# Patient Record
Sex: Female | Born: 1960 | Race: White | Hispanic: No | Marital: Married | Smoking: Never smoker
Health system: Southern US, Community
[De-identification: ages and names within clinical notes are randomized; demographics above are authoritative.]

## PROBLEM LIST (undated history)

## (undated) DIAGNOSIS — E079 Disorder of thyroid, unspecified: Secondary | ICD-10-CM

## (undated) DIAGNOSIS — R002 Palpitations: Secondary | ICD-10-CM

## (undated) DIAGNOSIS — Z8659 Personal history of other mental and behavioral disorders: Secondary | ICD-10-CM

## (undated) DIAGNOSIS — K635 Polyp of colon: Secondary | ICD-10-CM

## (undated) DIAGNOSIS — T7840XA Allergy, unspecified, initial encounter: Secondary | ICD-10-CM

## (undated) DIAGNOSIS — Z86018 Personal history of other benign neoplasm: Secondary | ICD-10-CM

## (undated) DIAGNOSIS — K824 Cholesterolosis of gallbladder: Secondary | ICD-10-CM

## (undated) DIAGNOSIS — D649 Anemia, unspecified: Secondary | ICD-10-CM

## (undated) HISTORY — DX: Anemia, unspecified: D64.9

## (undated) HISTORY — DX: Personal history of other mental and behavioral disorders: Z86.59

## (undated) HISTORY — DX: Allergy, unspecified, initial encounter: T78.40XA

## (undated) HISTORY — DX: Personal history of other benign neoplasm: Z86.018

## (undated) HISTORY — DX: Cholesterolosis of gallbladder: K82.4

## (undated) HISTORY — DX: Disorder of thyroid, unspecified: E07.9

## (undated) HISTORY — DX: Palpitations: R00.2

## (undated) HISTORY — DX: Polyp of colon: K63.5

---

## 2003-05-17 HISTORY — PX: ABDOMINAL HYSTERECTOMY: SHX81

## 2007-01-18 ENCOUNTER — Ambulatory Visit: Payer: Self-pay | Admitting: *Deleted

## 2008-05-16 HISTORY — PX: CHOLECYSTECTOMY: SHX55

## 2008-05-16 HISTORY — PX: TUMOR REMOVAL: SHX12

## 2008-12-25 ENCOUNTER — Encounter: Admission: RE | Admit: 2008-12-25 | Discharge: 2008-12-25 | Payer: Self-pay | Admitting: Family Medicine

## 2009-04-02 ENCOUNTER — Encounter: Admission: RE | Admit: 2009-04-02 | Discharge: 2009-04-02 | Payer: Self-pay | Admitting: Family Medicine

## 2010-06-06 ENCOUNTER — Encounter: Payer: Self-pay | Admitting: Family Medicine

## 2010-06-07 ENCOUNTER — Encounter: Payer: Self-pay | Admitting: Family Medicine

## 2010-07-24 IMAGING — MG MM SCREEN MAMMOGRAM BILATERAL
4 series · 4 of 4 positions shown · non-contrast
Comparison: none

DG SCREEN MAMMOGRAM BILATERAL
Bilateral CC and MLO view(s) were taken.

DIGITAL SCREENING MAMMOGRAM WITH CAD:
The breast tissue is heterogeneously dense.  No masses or malignant type calcifications are 
identified.
Images were processed with CAD.

[R CC]
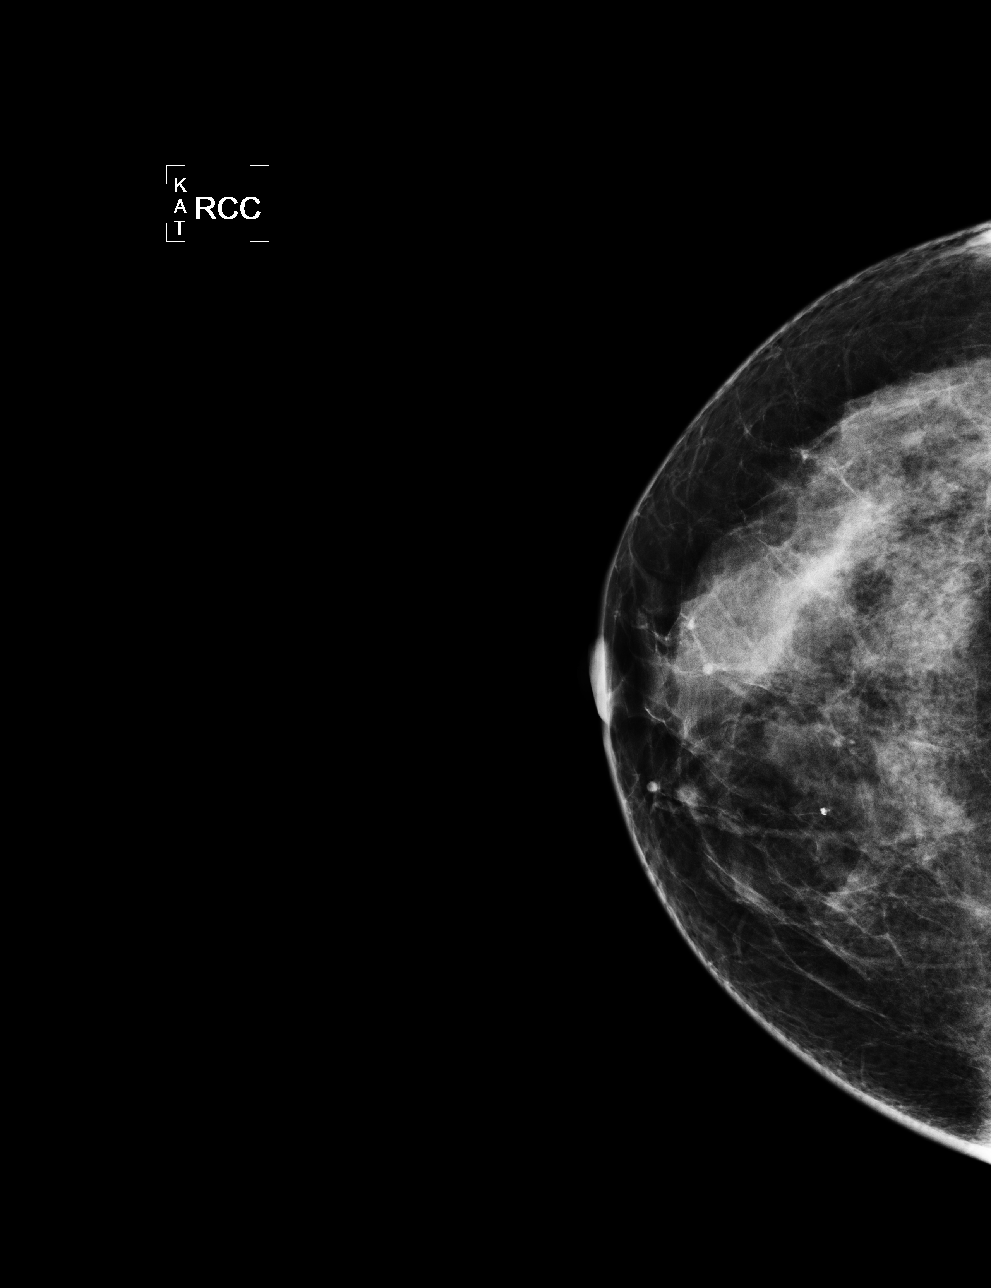

[L CC]
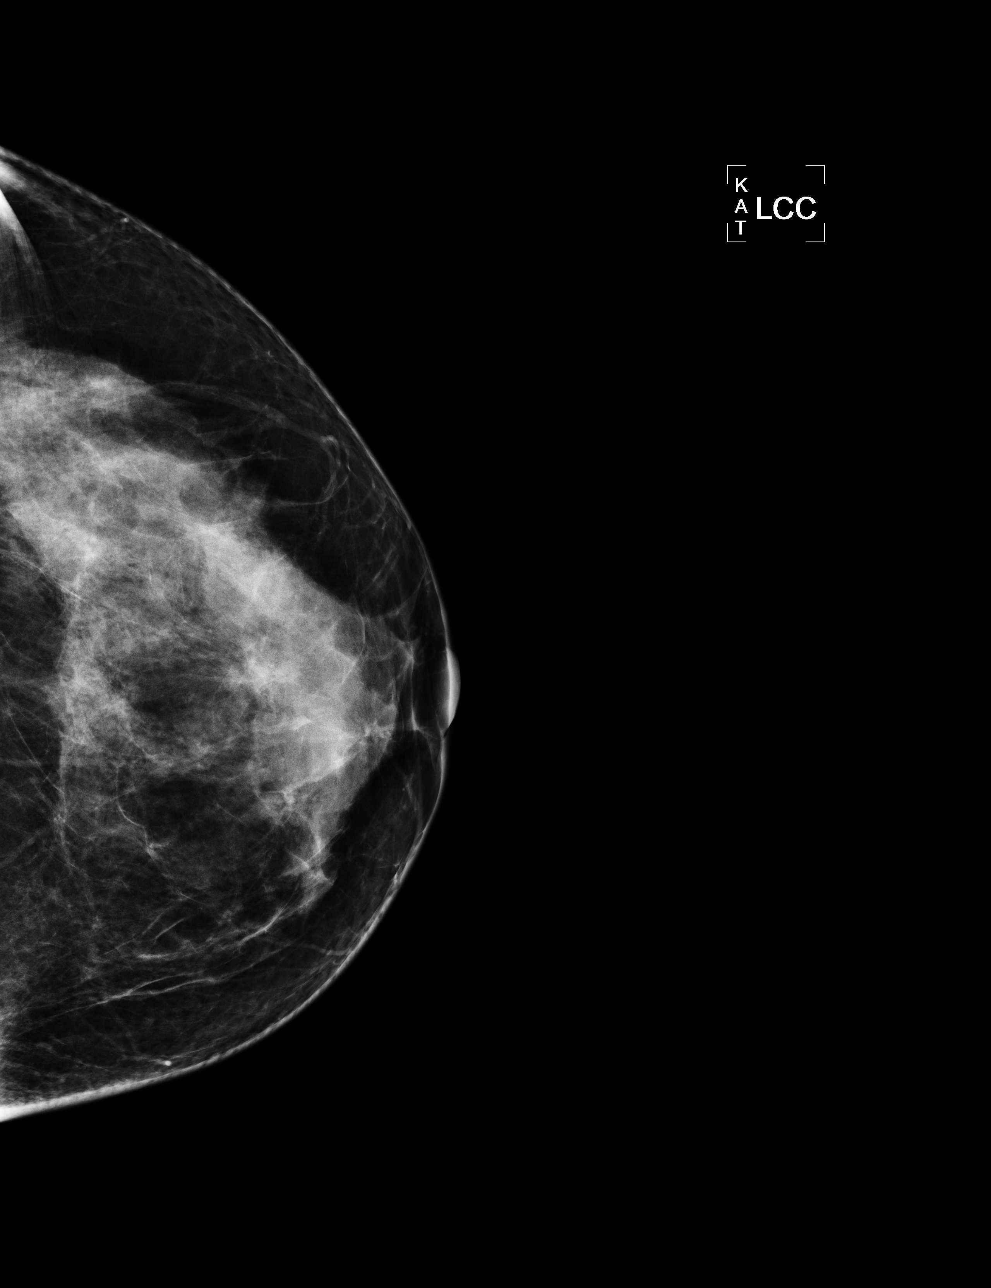

[L MLO]
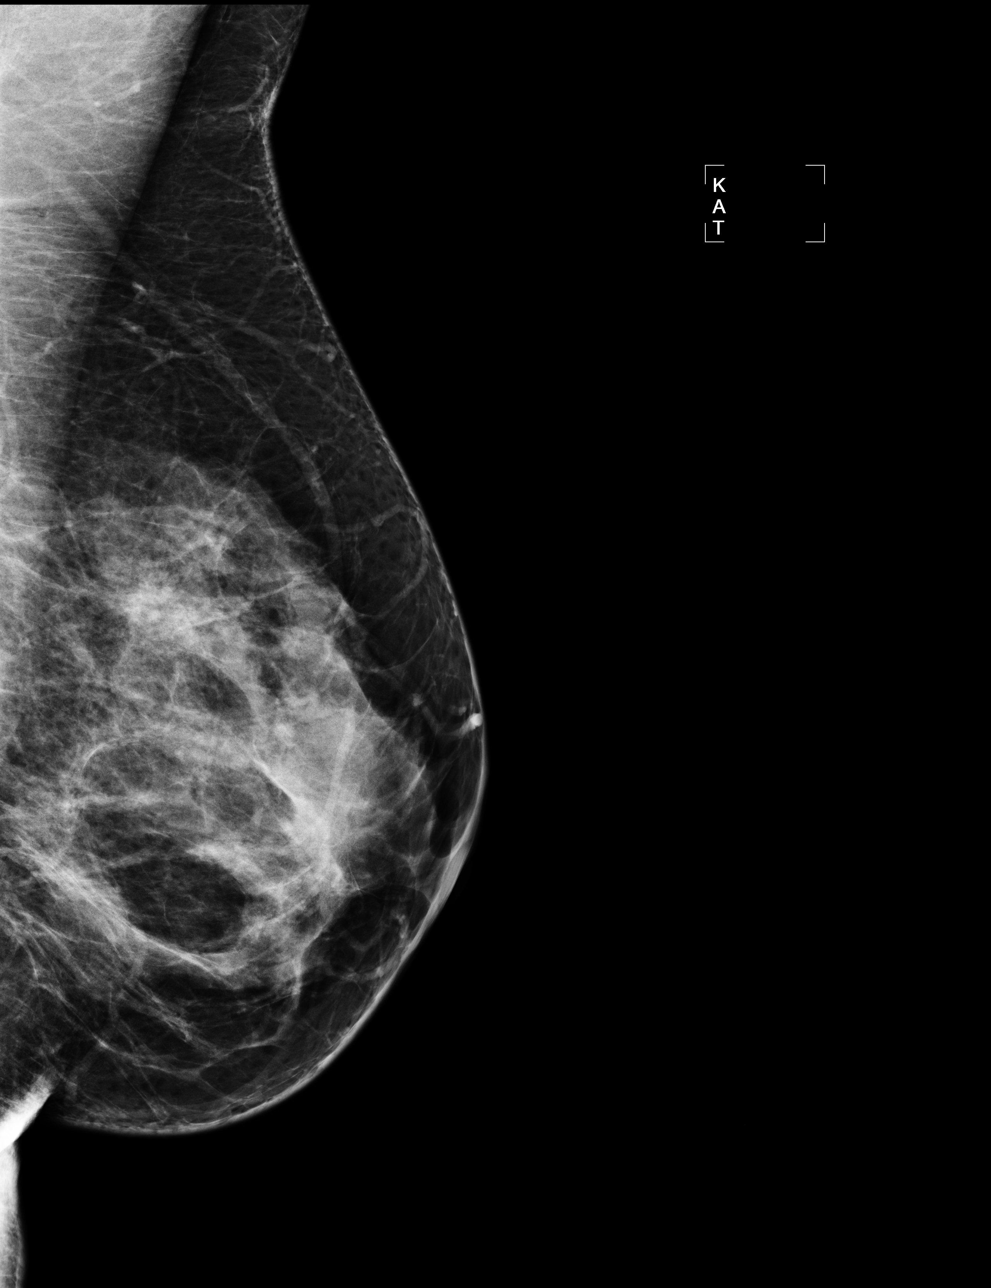

[R MLO]
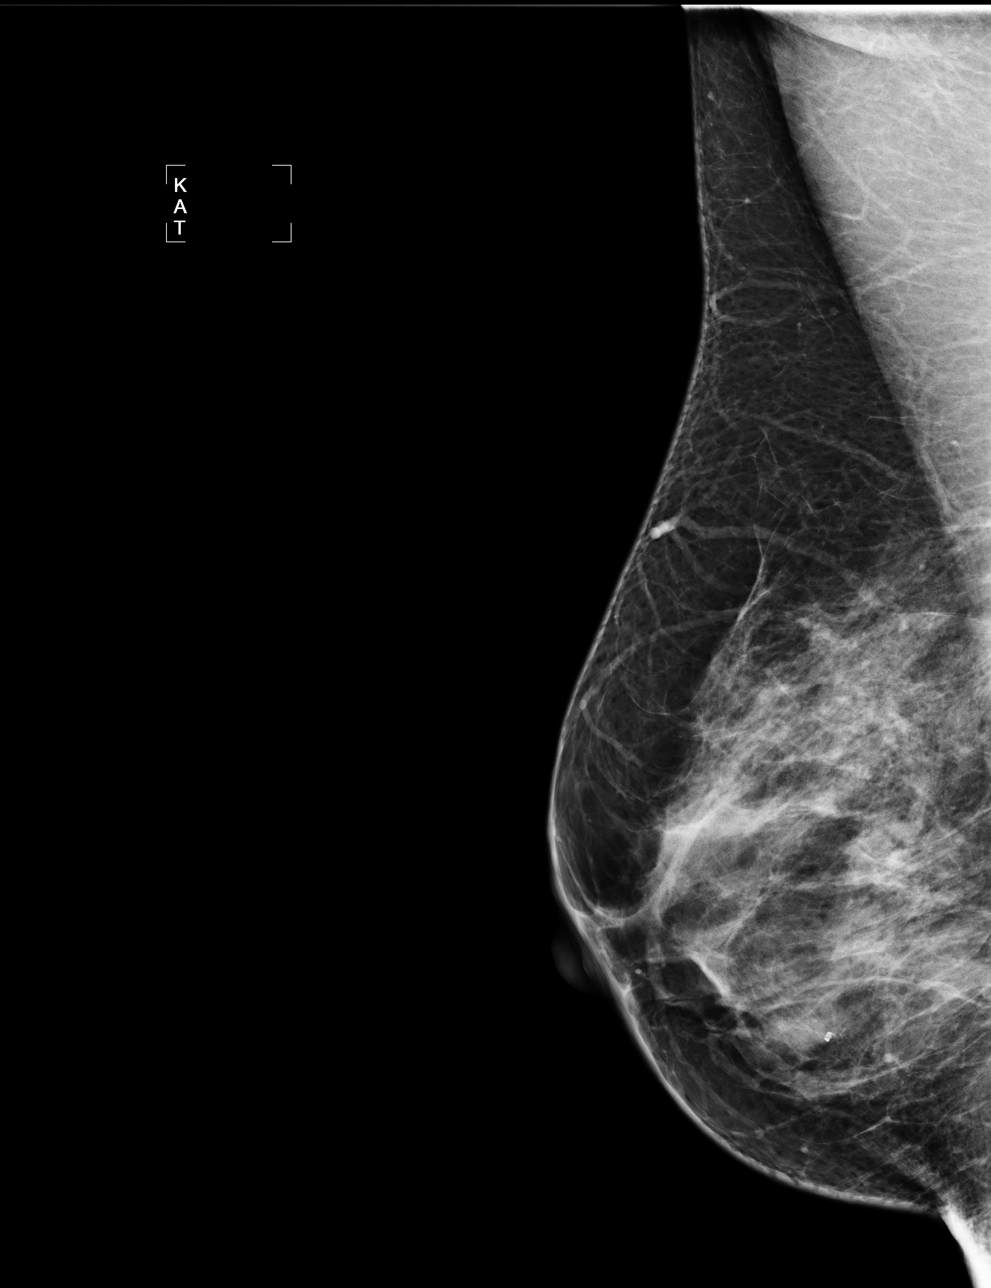

[4 of 4 positions shown; findings below may reference images not displayed]

IMPRESSION: No specific mammographic evidence of malignancy.  Next screening mammogram is recommended in one 
year.

A result letter of this screening mammogram will be mailed directly to the patient.

ASSESSMENT: Negative - BI-RADS 1

Screening mammogram in 1 year.
,

## 2010-09-28 NOTE — Procedures (Signed)
DUPLEX DEEP VENOUS EXAM - LOWER EXTREMITY   INDICATION:  Right knee pain.   HISTORY:  Edema:  No.  Trauma/Surgery:  No.  Pain:  Patient complains of right knee pain for approximately one week.  PE:  No.  Previous DVT:  No.  Anticoagulants:  No.  Other:  No.    DUPLEX EXAM:                CFV    SFV    PopV  PTV    GSV                R   L  R   L  R  L  R   L  R  L  Thrombosis    O.  O. O.     O.    O.     O.  Spontaneous   +   +  +      +     +      +  Phasic        +   +  +      +     +      +  Augmentation  +   +  +      +     +      +  Compressible  +   +  +      +     +      +  Competent     +   +  +      +     +      +   Legend:  + - yes  o - no  p - partial  D - decreased   IMPRESSION:  No evidence of right leg deep venous thrombosis,  superficial venous thrombosis or Baker cyst.    _____________________________  Quita Skye. Hart Rochester, M.D.   MC/MEDQ  D:  01/18/2007  T:  01/18/2007  Job:  [id number]

## 2015-05-17 HISTORY — PX: COLONOSCOPY W/ POLYPECTOMY: SHX1380

## 2018-03-07 DIAGNOSIS — Z1231 Encounter for screening mammogram for malignant neoplasm of breast: Secondary | ICD-10-CM | POA: Diagnosis not present

## 2018-03-08 LAB — HM MAMMOGRAPHY

## 2018-06-08 ENCOUNTER — Encounter: Payer: Self-pay | Admitting: Family Medicine

## 2018-06-08 ENCOUNTER — Ambulatory Visit (INDEPENDENT_AMBULATORY_CARE_PROVIDER_SITE_OTHER): Payer: 59 | Admitting: Family Medicine

## 2018-06-08 VITALS — BP 118/66 | HR 57 | Temp 98.8°F | Resp 16 | Ht 66.0 in | Wt 144.1 lb

## 2018-06-08 DIAGNOSIS — L659 Nonscarring hair loss, unspecified: Secondary | ICD-10-CM | POA: Insufficient documentation

## 2018-06-08 DIAGNOSIS — Z7689 Persons encountering health services in other specified circumstances: Secondary | ICD-10-CM | POA: Diagnosis not present

## 2018-06-08 DIAGNOSIS — E079 Disorder of thyroid, unspecified: Secondary | ICD-10-CM

## 2018-06-08 DIAGNOSIS — R5383 Other fatigue: Secondary | ICD-10-CM | POA: Diagnosis not present

## 2018-06-08 DIAGNOSIS — R002 Palpitations: Secondary | ICD-10-CM | POA: Insufficient documentation

## 2018-06-08 NOTE — Patient Instructions (Addendum)
Palpitations Palpitations are feelings that your heartbeat is not normal. Your heartbeat may feel like it is:  Uneven.  Faster than normal.  Fluttering.  Skipping a beat. This is usually not a serious problem. In some cases, you may need tests to rule out any serious problems. Follow these instructions at home: Pay attention to any changes in your condition. Take these actions to help manage your symptoms: Eating and drinking  Avoid: ? Coffee, tea, soft drinks, and energy drinks. ? Chocolate. ? Alcohol. ? Diet pills. Lifestyle   Try to lower your stress. These things can help you relax: ? Yoga. ? Deep breathing and meditation. ? Exercise. ? Using words and images to create positive thoughts (guided imagery). ? Using your mind to control things in your body (biofeedback).  Do not use drugs.  Get plenty of rest and sleep. Keep a regular bed time. General instructions   Take over-the-counter and prescription medicines only as told by your doctor.  Do not use any products that contain nicotine or tobacco, such as cigarettes and e-cigarettes. If you need help quitting, ask your doctor.  Keep all follow-up visits as told by your doctor. This is important. You may need more tests if palpitations do not go away or get worse. Contact a doctor if:  Your symptoms last more than 24 hours.  Your symptoms occur more often. Get help right away if you:  Have chest pain.  Feel short of breath.  Have a very bad headache.  Feel dizzy.  Pass out (faint). Summary  Palpitations are feelings that your heartbeat is uneven or faster than normal. It may feel like your heart is fluttering or skipping a beat.  Avoid food and drinks that may cause palpitations. These include caffeine, chocolate, and alcohol.  Try to lower your stress. Do not smoke or use drugs.  Get help right away if you faint or have chest pain, shortness of breath, a severe headache, or dizziness. This  information is not intended to replace advice given to you by your health care provider. Make sure you discuss any questions you have with your health care provider. Document Released: 02/09/2008 Document Revised: 06/14/2017 Document Reviewed: 06/14/2017 Elsevier Interactive Patient Education  2019 [employer]    Please help us help you:  We are [facility]. Below you will find basic instructions that you may need to access in the future. Please help us help you by reading the instructions, which cover many of the frequent questions we experience.   Prescription refills and request:  -In order to allow more efficient response time, please call your pharmacy for all refills. They will forward the request electronically to us. This allows for the quickest possible response. Request left on a nurse line can take longer to refill, since these are checked as time allows between office patients and other phone calls.  - refill request can take up to 3-5 working days to complete.  - If request is sent electronically and request is appropiate, it is usually completed in 1-2 business days.  - all patients will need to be seen routinely for all chronic medical conditions requiring prescription medications (see follow-up below). If you are overdue for follow up on your condition, you will be asked to make an appointment and we will call in enough medication to cover you until your appointment (up to 30 days).  - all controlled substances will require a face to  face visit to request/refill.  - if you desire your prescriptions to go through a new pharmacy, and have an active script at original pharmacy, you will need to call your pharmacy and have scripts transferred to new pharmacy. This is completed between the pharmacy locations and not by your provider.    Results: If any images or labs were ordered, it can take up to 1 week to get results  depending on the test ordered and the lab/facility running and resulting the test. - Normal or stable results, which do not need further discussion, may be released to your mychart immediately with attached note to you. A call may not be generated for normal results. Please make certain to sign up for mychart. If you have questions on how to activate your mychart you can call the front office.  - If your results need further discussion, our office will attempt to contact you via phone, and if unable to reach you after 2 attempts, we will release your abnormal result to your mychart with instructions.  - All results will be automatically released in mychart after 1 week.  - Your provider will provide you with explanation and instruction on all relevant material in your results. Please keep in mind, results and labs may appear confusing or abnormal to the untrained eye, but it does not mean they are actually abnormal for you personally. If you have any questions about your results that are not covered, or you desire more detailed explanation than what was provided, you should make an appointment with your provider to do so.   Our office handles many outgoing and incoming calls daily. If we have not contacted you within 1 week about your results, please check your mychart to see if there is a message first and if not, then contact our office.  In helping with this matter, you help decrease call volume, and therefore allow [place] to be able to respond to patients needs more efficiently.   Acute office visits (sick visit):  An acute visit is intended for a new problem and are scheduled in shorter time slots to allow schedule openings for patients with new problems. This is the appropriate visit to discuss a new problem. Problems will not be addressed by phone call or Echart message. Appointment is needed if requesting treatment. In order to provide you with excellent quality medical care with proper time for you to  explain your problem, have an exam and receive treatment with instructions, these appointments should be limited to one new problem per visit. If you experience a new problem, in which you desire to be addressed, please make an acute office visit, we save openings on the schedule to accommodate you. Please do not save your new problem for any other type of visit, let us take care of it properly and quickly for you.   Follow up visits:  Depending on your condition(s) your provider will need to see you routinely in order to provide you with quality care and prescribe medication(s). Most chronic conditions (Example: hypertension, Diabetes, depression/anxiety... etc), require visits a couple times a year. Your provider will instruct you on proper follow up for your personal medical conditions and history. Please make certain to make follow up appointments for your condition as instructed. Failing to do so could result in lapse in your medication treatment/refills. If you request a refill, and are overdue to be seen on a condition, we will always provide you with a 30 day script (once) to  allow you time to schedule.    Medicare wellness (well visit): - we have a wonderful Nurse Selena Batten), that will meet with you and provide you will yearly medicare wellness visits. These visits should occur yearly (can not be scheduled less than 1 calendar year apart) and cover preventive health, immunizations, advance directives and screenings you are entitled to yearly through your medicare benefits. Do not miss out on your entitled benefits, this is when medicare will pay for these benefits to be ordered for you.  These are strongly encouraged by your provider and is the appropriate type of visit to make certain you are up to date with all preventive health benefits. If you have not had your medicare wellness exam in the last 12 months, please make certain to schedule one by calling the office and schedule your medicare wellness  with Selena Batten as soon as possible.   Yearly physical (well visit):  - Adults are recommended to be seen yearly for physicals. Check with your insurance and date of your last physical, most insurances require one calendar year between physicals. Physicals include all preventive health topics, screenings, medical exam and labs that are appropriate for gender/age and history. You may have fasting labs needed at this visit. This is a well visit (not a sick visit), new problems should not be covered during this visit (see acute visit).  - Pediatric patients are seen more frequently when they are younger. Your provider will advise you on well child visit timing that is appropriate for your their age. - This is not a medicare wellness visit. Medicare wellness exams do not have an exam portion to the visit. Some medicare companies allow for a physical, some do not allow a yearly physical. If your medicare allows a yearly physical you can schedule the medicare wellness with our nurse Selena Batten and have your physical with your provider after, on the same day. Please check with insurance for your full benefits.   Late Policy/No Shows:  - all new patients should arrive 15-30 minutes earlier than appointment to allow [place] time  to  obtain all personal demographics,  insurance information and for you to complete office paperwork. - All established patients should arrive 10-15 minutes earlier than appointment time to update all information and be checked in .  - In our best efforts to run on time, if you are late for your appointment you will be asked to either reschedule or if able, we will work you back into the schedule. There will be a wait time to work you back in the schedule,  depending on availability.  - If you are unable to make it to your appointment as scheduled, please call 24 hours ahead of time to allow [place] to fill the time slot with someone else who needs to be seen. If you do not cancel your appointment ahead of  time, you may be charged a no show fee.

## 2018-06-08 NOTE — Progress Notes (Signed)
Patient ID: Carrie Zamora, female  DOB: 1960/07/21, 58 y.o.   MRN: [id number] Patient Care Team    Relationship Specialty Notifications Start End  Natalia LeatherwoodKuneff, Renee A, DO PCP - General Family Medicine  06/08/18   Charna ElizabethMann, Jyothi, MD Consulting Physician Gastroenterology  06/11/18   [facility]    06/11/18     Chief Complaint  Patient presents with  . Establish Care    Pt complaints of having on and off again heart palpatations. Pt has concerns with her thyroid level as she has taken meds in the past for this.     Subjective:  Carrie Zamora is a 58 y.o.  female present for new patient establishment. All past medical history, surgical history, allergies, family history, immunizations, medications and social history were updated in the electronic medical record today. All recent labs, ED visits and hospitalizations within the last year were reviewed.  Palpitations:  Patient presents for new patient establishment with complaints of worsening palpitations.  She states she has had a thyroid disorder in the past and had needed medications.  However once medication was started at low dose she became over replaced.  She states when her thyroid was off she was feeling like she is today.  She reports she has had palpitations for about 10 years, but she has noticed them become worse over the last few weeks.  She feels the palpitations will be sustained for at least a minute.  Is any bowel changes.  She reports she is always fatigued, uncertain if that is worsened.  She does endorse a great deal of thinning hair.  Depression screen PHQ 2/9 06/08/2018  Decreased Interest 0  Down, Depressed, Hopeless 0  PHQ - 2 Score 0   No exam data present  Past Medical History:  Diagnosis Date  . Allergy   . Anemia   . Colon polyp   . Gallbladder polyp    since had cholecystectomy.   . H/O benign gastric tumor   . H/O eating disorder    As a young adult  . Palpitations 2010  . Thyroid disease     Allergies  Allergen Reactions  . Tetanus Toxoids Swelling  . Soy Allergy Other (See Comments)    Nausea and cramping   . Corn-Containing Products Other (See Comments)    No reaction just was told by allergist   . Hydrocodone Rash   Past Surgical History:  Procedure Laterality Date  . ABDOMINAL HYSTERECTOMY  2005  . CESAREAN SECTION  1984  . CHOLECYSTECTOMY  2010  . COLONOSCOPY W/ POLYPECTOMY  2017   Dr. Loreta AveMann  . TUMOR REMOVAL  2010   Tumors removed from stomach- Benign    Family History  Problem Relation Age of Onset  . Arthritis Mother   . Stroke Mother   . Arthritis/Rheumatoid Mother   . Diabetes Father   . Diabetes Sister   . Arthritis Maternal Grandmother   . Stroke Maternal Grandmother   . Stroke Maternal Grandfather   . Alcohol abuse Maternal Grandfather   . Alcohol abuse Paternal Grandmother   . Diabetes Paternal Grandfather   . Alcohol abuse Paternal Grandfather   . Asthma Sister    Social History   Socioeconomic History  . Marital status: Married    Spouse name: Not on file  . Number of children: 2  . Years of education: Not on file  . Highest education level: Not on file  Occupational History  . Not on file  Social Needs  . Financial resource strain: Not on file  . Food insecurity:    Worry: Not on file    Inability: Not on file  . Transportation needs:    Medical: Not on file    Non-medical: Not on file  Tobacco Use  . Smoking status: Never Smoker  . Smokeless tobacco: Never Used  Substance and Sexual Activity  . Alcohol use: Yes    Alcohol/week: 1.0 standard drinks    Types: 1 Glasses of wine per week    Comment: 1 glass of wine a month   . Drug use: Never  . Sexual activity: Yes    Partners: Male  Lifestyle  . Physical activity:    Days per week: Not on file    Minutes per session: Not on file  . Stress: Not on file  Relationships  . Social connections:    Talks on phone: Not on file    Gets together: Not on file    Attends  religious service: Not on file    Active member of club or organization: Not on file    Attends meetings of clubs or organizations: Not on file    Relationship status: Not on file  . Intimate partner violence:    Fear of current or ex partner: Not on file    Emotionally abused: Not on file    Physically abused: Not on file    Forced sexual activity: Not on file  Other Topics Concern  . Not on file  Social History Narrative   Marital status/children/pets: married   Education/employment: colege- Research officer, political party:      -smoke alarm in the home:Yes     - wears seatbelt: Yes     - Feels safe in their relationships: Yes   Allergies as of 06/08/2018      Reactions   Tetanus Toxoids Swelling   Soy Allergy Other (See Comments)   Nausea and cramping    Corn-containing Products Other (See Comments)   No reaction just was told by allergist    Hydrocodone Rash      Medication List    as of June 08, 2018 11:59 PM   You have not been prescribed any medications.     All past medical history, surgical history, allergies, family history, immunizations andmedications were updated in the EMR today and reviewed under the history and medication portions of their EMR.    Recent Results (from the past 2160 hour(s))  TSH     Status: None   Collection Time: 06/08/18  3:13 PM  Result Value Ref Range   TSH 1.31 0.40 - 4.50 mIU/L  T4, free     Status: None   Collection Time: 06/08/18  3:13 PM  Result Value Ref Range   Free T4 1.1 0.8 - 1.8 ng/dL  CBC w/Diff     Status: Abnormal   Collection Time: 06/08/18  3:13 PM  Result Value Ref Range   WBC 3.7 (L) 3.8 - 10.8 Thousand/uL   RBC 4.54 3.80 - 5.10 Million/uL   Hemoglobin 14.0 11.7 - 15.5 g/dL   HCT 16.1 09.6 - 04.5 %   MCV 89.0 80.0 - 100.0 fL   MCH 30.8 27.0 - 33.0 pg   MCHC 34.7 32.0 - 36.0 g/dL   RDW 40.9 81.1 - 91.4 %   Platelets 285 140 - 400 Thousand/uL   MPV 10.0 7.5 - 12.5 fL   Neutro Abs 1,628 1,500 - 7,800 cells/uL  Lymphs Abs 1,547 850 - 3,900 cells/uL   Absolute Monocytes 374 200 - 950 cells/uL   Eosinophils Absolute 111 15 - 500 cells/uL   Basophils Absolute 41 0 - 200 cells/uL   Neutrophils Relative % 44 %   Total Lymphocyte 41.8 %   Monocytes Relative 10.1 %   Eosinophils Relative 3.0 %   Basophils Relative 1.1 %  Iron, TIBC and Ferritin Panel     Status: None   Collection Time: 06/08/18  3:13 PM  Result Value Ref Range   Iron 110 45 - 160 mcg/dL   TIBC 119295 147250 - 829450 mcg/dL (calc)   %SAT 37 16 - 45 % (calc)   Ferritin 103 16 - 232 ng/mL  Vitamin D (25 hydroxy)     Status: Abnormal   Collection Time: 06/08/18  3:13 PM  Result Value Ref Range   Vit D, 25-Hydroxy 18 (L) 30 - 100 ng/mL    Comment: Vitamin D Status         25-OH Vitamin D: . Deficiency:                    <20 ng/mL Insufficiency:             20 - 29 ng/mL Optimal:                 > or = 30 ng/mL . For 25-OH Vitamin D testing on patients on  D2-supplementation and patients for whom quantitation  of D2 and D3 fractions is required, the QuestAssureD(TM) 25-OH VIT D, (D2,D3), LC/MS/MS is recommended: order  code [id number] (patients >2948yrs). . For more information on this test, go to: http://education.questdiagnostics.com/faq/FAQ163 (This link is being provided for  informational/educational purposes only.)       ROS: 14 pt review of systems performed and negative (unless mentioned in an HPI)  Objective: BP 118/66 (BP Location: Right Arm, Patient Position: Sitting, Cuff Size: Normal)   Pulse (!) 57   Temp 98.8 F (37.1 C) (Oral)   Resp 16   Ht 5\' 6"  (1.676 m)   Wt 144 lb 2 oz (65.4 kg)   SpO2 99%   BMI 23.26 kg/m  Gen: Afebrile. No acute distress. Nontoxic in appearance, well-developed, well-nourished,  Pleasant female.  HENT: AT. [place]. Bilateral TM visualized and normal in appearance, normal external auditory canal. MMM, no oral lesions, adequate dentition. Bilateral nares within normal limits. Throat without  erythema, ulcerations or exudates. Eyes:Pupils Equal Round Reactive to light, Extraocular movements intact,  Conjunctiva without redness, discharge or icterus. Neck/lymp/endocrine: Supple,no lymphadenopathy, no thyromegaly CV: RRR no murmur, no edema, +2/4 P posterior tibialis pulses. * Chest: CTAB, no wheeze, rhonchi or crackles.  Abd: Soft. NTND. BS present. no Masses palpated. No hepatosplenomegaly. No rebound tenderness or guarding. Skin: Warm and well-perfused. Skin intact. Neuro/Msk:  Normal gait. PERLA. EOMi. Alert. Oriented x3.   Psych: Normal affect, dress and demeanor. Normal speech. Normal thought content and judgment.   Assessment/plan: Carrie Zamora is a 58 y.o. female present for Est care.  Palpitations/Hair loss/Other fatigue/Thyroid disorder -Lab work today to rule out thyroid disorder, anemia, iron deficiency is causes palpitations.  Also collect vitamin D to rule out as potential cause of hair thinning. -Discussed need of cardiology referral if no causes found for worsening palpitations by labs.  Patient is currently asymptomatic with palpitations, but they are worsening.  - TSH - T4, free - CBC w/Diff - Iron, TIBC and Ferritin Panel - Vitamin D (25 hydroxy) -Follow-up  yearly for CPE when due.  Sooner if labs indicate need.    Return if symptoms worsen or fail to improve.  Greater than 30 minutes spent with patient, >50% of time spent face to face counseling   Note is dictated utilizing voice recognition software. Although note has been proof read prior to signing, occasional typographical errors still can be missed. If any questions arise, please do not hesitate to call for verification.  Electronically signed by: Felix Pacini, DO [facility] [place]

## 2018-06-09 LAB — CBC WITH DIFFERENTIAL/PLATELET
ABSOLUTE MONOCYTES: 374 {cells}/uL (ref 200–950)
Basophils Absolute: 41 cells/uL (ref 0–200)
Basophils Relative: 1.1 %
Eosinophils Absolute: 111 cells/uL (ref 15–500)
Eosinophils Relative: 3 %
HCT: 40.4 % (ref 35.0–45.0)
Hemoglobin: 14 g/dL (ref 11.7–15.5)
Lymphs Abs: 1547 cells/uL (ref 850–3900)
MCH: 30.8 pg (ref 27.0–33.0)
MCHC: 34.7 g/dL (ref 32.0–36.0)
MCV: 89 fL (ref 80.0–100.0)
MPV: 10 fL (ref 7.5–12.5)
Monocytes Relative: 10.1 %
NEUTROS PCT: 44 %
Neutro Abs: 1628 cells/uL (ref 1500–7800)
PLATELETS: 285 10*3/uL (ref 140–400)
RBC: 4.54 10*6/uL (ref 3.80–5.10)
RDW: 12.5 % (ref 11.0–15.0)
TOTAL LYMPHOCYTE: 41.8 %
WBC: 3.7 10*3/uL — AB (ref 3.8–10.8)

## 2018-06-09 LAB — IRON,TIBC AND FERRITIN PANEL
%SAT: 37 % (calc) (ref 16–45)
Ferritin: 103 ng/mL (ref 16–232)
Iron: 110 ug/dL (ref 45–160)
TIBC: 295 mcg/dL (calc) (ref 250–450)

## 2018-06-09 LAB — TSH: TSH: 1.31 m[IU]/L (ref 0.40–4.50)

## 2018-06-09 LAB — VITAMIN D 25 HYDROXY (VIT D DEFICIENCY, FRACTURES): Vit D, 25-Hydroxy: 18 ng/mL — ABNORMAL LOW (ref 30–100)

## 2018-06-09 LAB — T4, FREE: Free T4: 1.1 ng/dL (ref 0.8–1.8)

## 2018-06-11 ENCOUNTER — Other Ambulatory Visit: Payer: Self-pay | Admitting: Family Medicine

## 2018-06-11 ENCOUNTER — Encounter: Payer: Self-pay | Admitting: Family Medicine

## 2018-06-11 DIAGNOSIS — R002 Palpitations: Secondary | ICD-10-CM

## 2018-06-11 DIAGNOSIS — E559 Vitamin D deficiency, unspecified: Secondary | ICD-10-CM | POA: Insufficient documentation

## 2018-06-11 MED ORDER — VITAMIN D (ERGOCALCIFEROL) 1.25 MG (50000 UNIT) PO CAPS
50000.0000 [IU] | ORAL_CAPSULE | ORAL | 0 refills | Status: DC
Start: 2018-06-11 — End: 2018-06-12

## 2018-06-11 NOTE — Telephone Encounter (Addendum)
Please inform patient the following information: Her vit d is very low at 22. Level should be at least 30, and closer to 40 best. Low D can cause fatigue, aches and hair thinning.  I have called in a high dose once weekly vit d- take with food for 12 weeks then follow up. She should  also start OTC Vit D 1000u daily. Continue the OTC even after prescribed is completed.   Her other labs were normal and do not explain her palpitations. I have referred her to cardiology to further evaluate and consider monitor.   Please have ask her pharmacist about her reported soy allergy and possible effects of the Vitd. It may contain some soy- to what degree I am not sure. If it causes her nausea we will need to try a different format.   COuld not call in med- pended- no pharmacy listed

## 2018-06-11 NOTE — Telephone Encounter (Signed)
Pt notified of results and advised to please call our office back regarding the pharmacy that she wants medication sent into.

## 2018-06-11 NOTE — Telephone Encounter (Signed)
Copied from CRM [id number]. Topic: General - Call Back - No Documentation >> Jun 11, 2018 10:23 AM Jay Schlichter wrote: Reason for CRM: pt is calling back to let us know that her preferred pharmacy is [facility] in [place]

## 2018-06-12 ENCOUNTER — Telehealth: Payer: Self-pay | Admitting: *Deleted

## 2018-06-12 MED ORDER — ERGOCALCIFEROL 50 MCG (2000 UT) PO TABS: 2.0000 | ORAL_TABLET | Freq: Every day | ORAL | 2 refills | Status: AC

## 2018-06-12 NOTE — Telephone Encounter (Signed)
Called in the tabs- need to take 2 daily of this tab.  Since taking this format and daily- hold the OTC vit d we spoke of prior. This is a tab format- I do not see a SOY warning- like the capsule was. However, she should make certain with her pharmacist

## 2018-06-12 NOTE — Addendum Note (Signed)
Addended by: Felix Pacini A on: 06/12/2018 04:41 PM   Modules accepted: Orders

## 2018-06-12 NOTE — Telephone Encounter (Signed)
Copied from CRM [id number]. Topic: General - Other >> Jun 12, 2018  1:28 PM Percival Spanish wrote:  Pt went to the pharmacy to pick up Vitamin D, Ergocalciferol, (DRISDOL) 1.25 MG (50000 UT) CAPS capsule  and she ask the pharmacy if it has soy and they told her they were not aware that she was allergic to soy and she is asking if we informed the pharmacy that she was allergic to soy >> Jun 12, 2018  2:22 PM Eulah Pont, CMA wrote: [facility] pharmacy, pharmacist already looking into if the Vitamin D has soy.  Patient advised that we do not send her medical information to the pharmacy, only her RX's.  Patient did not seem happy with my response. >> Jun 12, 2018  3:23 PM Floria Raveling A wrote: Pt called in and the Pharmacist did tell her that there is soy in the vit D, she would like to know what other options she has?

## 2018-06-12 NOTE — Telephone Encounter (Signed)
Pt called concerned that the Vitamin D may contain soy which she is allergic to.   Per Misty Stanley pharmacist is looking to see if the Vitamin D contains soy.   Pt called back stating that the pharmacist told her the Vitamin D does contain soy. Pt is requesting alternative.   Please advise. Thanks.

## 2018-06-12 NOTE — Telephone Encounter (Signed)
Left message for pt to call back  °

## 2018-06-13 NOTE — Telephone Encounter (Signed)
Pt advised and voiced understanding.   

## 2018-06-18 ENCOUNTER — Encounter: Payer: Self-pay | Admitting: Family Medicine
